# Patient Record
Sex: Female | Born: 1995 | Race: Black or African American | Hispanic: No | Marital: Single | Smoking: Never smoker
Health system: Southern US, Community
[De-identification: ages and names within clinical notes are randomized; demographics above are authoritative.]

## PROBLEM LIST (undated history)

## (undated) DIAGNOSIS — D649 Anemia, unspecified: Secondary | ICD-10-CM

## (undated) DIAGNOSIS — E559 Vitamin D deficiency, unspecified: Secondary | ICD-10-CM

## (undated) HISTORY — DX: Anemia, unspecified: D64.9

## (undated) HISTORY — DX: Vitamin D deficiency, unspecified: E55.9

---
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
## 2016-03-05 DIAGNOSIS — D500 Iron deficiency anemia secondary to blood loss (chronic): Secondary | ICD-10-CM | POA: Diagnosis not present

## 2016-03-05 DIAGNOSIS — R5383 Other fatigue: Secondary | ICD-10-CM | POA: Diagnosis not present

## 2016-03-05 DIAGNOSIS — J309 Allergic rhinitis, unspecified: Secondary | ICD-10-CM | POA: Diagnosis not present

## 2016-03-05 DIAGNOSIS — D508 Other iron deficiency anemias: Secondary | ICD-10-CM | POA: Diagnosis not present

## 2017-01-19 DIAGNOSIS — D509 Iron deficiency anemia, unspecified: Secondary | ICD-10-CM | POA: Diagnosis not present

## 2017-01-19 DIAGNOSIS — R5383 Other fatigue: Secondary | ICD-10-CM | POA: Diagnosis not present

## 2017-01-19 DIAGNOSIS — Z13220 Encounter for screening for lipoid disorders: Secondary | ICD-10-CM | POA: Diagnosis not present

## 2017-01-19 DIAGNOSIS — N921 Excessive and frequent menstruation with irregular cycle: Secondary | ICD-10-CM | POA: Diagnosis not present

## 2017-01-19 DIAGNOSIS — Z0000 Encounter for general adult medical examination without abnormal findings: Secondary | ICD-10-CM | POA: Diagnosis not present

## 2017-04-07 DIAGNOSIS — L509 Urticaria, unspecified: Secondary | ICD-10-CM | POA: Diagnosis not present

## 2017-04-07 DIAGNOSIS — L578 Other skin changes due to chronic exposure to nonionizing radiation: Secondary | ICD-10-CM | POA: Diagnosis not present

## 2017-05-03 DIAGNOSIS — D509 Iron deficiency anemia, unspecified: Secondary | ICD-10-CM | POA: Diagnosis not present

## 2017-05-03 DIAGNOSIS — E559 Vitamin D deficiency, unspecified: Secondary | ICD-10-CM | POA: Diagnosis not present

## 2017-05-03 DIAGNOSIS — R5383 Other fatigue: Secondary | ICD-10-CM | POA: Diagnosis not present

## 2017-05-03 DIAGNOSIS — R202 Paresthesia of skin: Secondary | ICD-10-CM | POA: Diagnosis not present

## 2017-05-03 DIAGNOSIS — N921 Excessive and frequent menstruation with irregular cycle: Secondary | ICD-10-CM | POA: Diagnosis not present

## 2018-08-18 DIAGNOSIS — J4520 Mild intermittent asthma, uncomplicated: Secondary | ICD-10-CM | POA: Diagnosis not present

## 2018-08-18 DIAGNOSIS — J029 Acute pharyngitis, unspecified: Secondary | ICD-10-CM | POA: Diagnosis not present

## 2018-08-18 DIAGNOSIS — J069 Acute upper respiratory infection, unspecified: Secondary | ICD-10-CM | POA: Diagnosis not present

## 2018-08-31 DIAGNOSIS — Z3041 Encounter for surveillance of contraceptive pills: Secondary | ICD-10-CM | POA: Diagnosis not present

## 2018-08-31 DIAGNOSIS — N946 Dysmenorrhea, unspecified: Secondary | ICD-10-CM | POA: Diagnosis not present

## 2018-10-11 DIAGNOSIS — G44209 Tension-type headache, unspecified, not intractable: Secondary | ICD-10-CM | POA: Diagnosis not present

## 2018-10-25 DIAGNOSIS — H52223 Regular astigmatism, bilateral: Secondary | ICD-10-CM | POA: Diagnosis not present

## 2018-12-20 DIAGNOSIS — R590 Localized enlarged lymph nodes: Secondary | ICD-10-CM | POA: Diagnosis not present

## 2019-09-11 DIAGNOSIS — Z30011 Encounter for initial prescription of contraceptive pills: Secondary | ICD-10-CM | POA: Diagnosis not present

## 2019-09-11 DIAGNOSIS — R591 Generalized enlarged lymph nodes: Secondary | ICD-10-CM | POA: Diagnosis not present

## 2020-04-13 DIAGNOSIS — Z20822 Contact with and (suspected) exposure to covid-19: Secondary | ICD-10-CM | POA: Diagnosis not present

## 2020-04-25 DIAGNOSIS — R21 Rash and other nonspecific skin eruption: Secondary | ICD-10-CM | POA: Diagnosis not present

## 2020-04-25 DIAGNOSIS — R202 Paresthesia of skin: Secondary | ICD-10-CM | POA: Diagnosis not present

## 2020-04-25 DIAGNOSIS — E559 Vitamin D deficiency, unspecified: Secondary | ICD-10-CM | POA: Diagnosis not present

## 2020-04-25 DIAGNOSIS — M79646 Pain in unspecified finger(s): Secondary | ICD-10-CM | POA: Diagnosis not present

## 2020-04-26 DIAGNOSIS — R202 Paresthesia of skin: Secondary | ICD-10-CM | POA: Diagnosis not present

## 2020-04-26 DIAGNOSIS — E559 Vitamin D deficiency, unspecified: Secondary | ICD-10-CM | POA: Diagnosis not present

## 2020-04-26 DIAGNOSIS — M79646 Pain in unspecified finger(s): Secondary | ICD-10-CM | POA: Diagnosis not present

## 2020-05-09 DIAGNOSIS — Z20828 Contact with and (suspected) exposure to other viral communicable diseases: Secondary | ICD-10-CM | POA: Diagnosis not present

## 2020-06-20 DIAGNOSIS — M2550 Pain in unspecified joint: Secondary | ICD-10-CM | POA: Diagnosis not present

## 2020-12-11 DIAGNOSIS — R202 Paresthesia of skin: Secondary | ICD-10-CM | POA: Diagnosis not present

## 2020-12-11 DIAGNOSIS — N921 Excessive and frequent menstruation with irregular cycle: Secondary | ICD-10-CM | POA: Diagnosis not present

## 2021-03-05 ENCOUNTER — Encounter: Payer: Self-pay | Admitting: *Deleted

## 2021-03-05 ENCOUNTER — Other Ambulatory Visit: Payer: Self-pay

## 2021-03-05 ENCOUNTER — Ambulatory Visit: Payer: BC Managed Care – PPO | Admitting: Neurology

## 2021-03-05 ENCOUNTER — Encounter: Payer: Self-pay | Admitting: Neurology

## 2021-03-05 VITALS — BP 121/80 | HR 58 | Ht 65.0 in | Wt 171.0 lb

## 2021-03-05 DIAGNOSIS — R200 Anesthesia of skin: Secondary | ICD-10-CM | POA: Insufficient documentation

## 2021-03-05 DIAGNOSIS — D649 Anemia, unspecified: Secondary | ICD-10-CM | POA: Insufficient documentation

## 2021-03-05 DIAGNOSIS — R202 Paresthesia of skin: Secondary | ICD-10-CM

## 2021-03-05 DIAGNOSIS — E538 Deficiency of other specified B group vitamins: Secondary | ICD-10-CM

## 2021-03-05 NOTE — Progress Notes (Signed)
[facility]    Provider:  Dr Lucia Gaskins Requesting Provider: Laurann Montana, MD Primary Care Provider:  Merri Brunette, MD  CC:  numbness and tingling in the hands  HPI:  Alexa Fowler is a 25 y.o. female here as requested by Laurann Montana, MD for bilateral intermittent hand numbness.  Past medical history eczema, allergic rhinitis, iron deficiency anemia, reactive airway disease, acute non-intractable tension type headache, paresthesias, GERD, mild intermittent asthma, vitamin D deficiency, dysmenorrhea.   Her hands get numb whenever she does anything strenuous, even washing hair, cleaning bathroom, the complete hand, nothing helps but time, she shakes them out but that doesn't help, she can get numb in the right arm when she is sleeping on it but otherwise no nocturnal or morning numbness. Lately with sleeping the arm gets numb too. Also tingly like falling asleep with the hands. Happens bilaterally, lasts a few minutes but progressively getting worse and lasting longer. Washing hair it lasts 10-15 minutes, harder activities lasted for an hour and nothing helps. Started 2-3 years, no inciting events, no neck pain or radicular symptoms, very uncomfortable when it occurs but not necessarily painful, she does feel her hands have been weak. Nothing in the feet, legs, torso or face. Occ she gets a shooting pain from her hip down to the leg. No other focal neurologic deficits, associated symptoms, inciting events or modifiable factors.    I reviewed Dr. Lucilla Lame notes, her exam is benign, symptoms are persisting, symptoms sound consistent with carpal tunnel but are not reproducible, referral to neurology.  Patient has seen rheumatology, appears she had joint pain, she did not have a rheumatologic diagnosis, ANA and RF were both positive, she continues to have numbness in her hands, otology recommended and neurologic referral, comes and goes may last 5 to 30 minutes maybe once a week not  associated with certain activities.  Her hands feel like they fell asleep with pins-and-needles, worse with doing activities such as washing hair lifting objects washing dishes, this resolves quickly after cessation of activity, has also noticed them in the toes.  Reviewed notes, labs and imaging from outside physicians, which showed:  Reviewed blood work including CBC, CMP, B12, ferritin collected April 26, 2020: CBC with differential normal, CMP essentially normal BUN 6 creatinine 0.68, vitamin D 49, vitamin B12 203, ANA IFA positive, rheumatoid factor positive.  Review of Systems: Patient complains of symptoms per HPI as well as the following symptoms numbness and tingling. Pertinent negatives and positives per HPI. All others negative.   Social History   Socioeconomic History   Marital status: Single    Spouse name: Not on file   Number of children: Not on file   Years of education: Not on file   Highest education level: Not on file  Occupational History   Not on file  Tobacco Use   Smoking status: Never   Smokeless tobacco: Never  Vaping Use   Vaping Use: Never used  Substance and Sexual Activity   Alcohol use: Yes    Comment: "not often"   Drug use: Never   Sexual activity: Not on file  Other Topics Concern   Not on file  Social History Narrative   Lives with family    Right handed   Caffeine: 1 cup/day   Social Determinants of Health   Financial Resource Strain: Not on file  Food Insecurity: Not on file  Transportation Needs: Not on file  Physical Activity: Not on file  Stress: Not on  file  Social Connections: Not on file  Intimate Partner Violence: Not on file    Family History  Problem Relation Age of Onset   Rheum arthritis Mother    Sleep apnea Mother    Heart Problems Father    Diabetes Father     Past Medical History:  Diagnosis Date   Anemia    Vitamin D deficiency     Patient Active Problem List   Diagnosis Date Noted   Anemia  03/05/2021   Numbness and tingling of hand 03/05/2021    History reviewed. No pertinent surgical history.  Current Outpatient Medications  Medication Sig Dispense Refill   ALBUTEROL IN Inhale into the lungs as needed.     Ascorbic Acid (VITAMIN C PO) Take by mouth.     Cetirizine HCl (ZYRTEC PO) Take by mouth daily.     Ferrous Sulfate (IRON PO) Take by mouth.     JUNEL FE 24 1-20 MG-MCG(24) tablet Take 1 tablet by mouth daily.     VITAMIN D PO Take by mouth.     No current facility-administered medications for this visit.    Allergies as of 03/05/2021   (Not on File)    Vitals: BP 121/80 (BP Location: Right Arm, Patient Position: Sitting)   Pulse (!) 58   Ht 5\' 5"  (1.651 m)   Wt 171 lb (77.6 kg)   BMI 28.46 kg/m  Last Weight:  Wt Readings from Last 1 Encounters:  03/05/21 171 lb (77.6 kg)   Last Height:   Ht Readings from Last 1 Encounters:  03/05/21 5\' 5"  (1.651 m)     Physical exam: Exam: Gen: NAD, conversant, well nourised, well groomed                     CV: RRR, no MRG. No Carotid Bruits. No peripheral edema, warm, nontender Eyes: Conjunctivae clear without exudates or hemorrhage  Neuro: Detailed Neurologic Exam  Speech:    Speech is normal; fluent and spontaneous with normal comprehension.  Cognition:    The patient is oriented to person, place, and time;     recent and remote memory intact;     language fluent;     normal attention, concentration,     fund of knowledge Cranial Nerves:    The pupils are equal, round, and reactive to light. The fundi are flat Visual fields are full to finger confrontation. Extraocular movements are intact. Trigeminal sensation is intact and the muscles of mastication are normal. The face is symmetric. The palate elevates in the midline. Hearing intact. Voice is normal. Shoulder shrug is normal. The tongue has normal motion without fasciculations.   Coordination:    Normal   Gait:    normal.   Motor  Observation:    No asymmetry, no atrophy, and no involuntary movements noted. Tone:    Normal muscle tone.    Posture:    Posture is normal. normal erect    Strength:    Strength is V/V in the upper and lower limbs.      Sensation: intact to LT     Reflex Exam:  DTR's:    Deep tendon reflexes in the upper and lower extremities are normal bilaterally.   Toes:    The toes are downgoing bilaterally.   Clonus:    Clonus is absent.    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 Advised conservative measures such as bracing. Will order EMG/NCS but patient can cancel if she feels much  better with bracing.   B12 202 which is very low so there is a possibility of B12 deficiency so will recheck and also add an MMA  Orders Placed This Encounter  Procedures   B12 and Folate Panel   Methylmalonic acid, serum   NCV with EMG(electromyography)   No orders of the defined types were placed in this encounter.   Cc: Laurann Montana, MD,  Merri Brunette, MD  Naomie Dean, MD  [facility] [address] [place], [place] [address]  Phone [phone] Fax [phone]

## 2021-03-05 NOTE — Patient Instructions (Signed)
  Carpal Tunnel Syndrome  Carpal tunnel syndrome is a condition that causes pain, numbness, and weakness in your hand and fingers. The carpal tunnel is a narrow area located on the palm side of your wrist. Repeated wrist motion or certain diseases may cause swelling within the tunnel. This swelling pinches the main nerve in the wrist.The main nerve in the wrist is called the median nerve. What are the causes? This condition may be caused by: Repeated and forceful wrist and hand motions. Wrist injuries. Arthritis. A cyst or tumor in the carpal tunnel. Fluid buildup during pregnancy. Use of tools that vibrate. Sometimes the cause of this condition is not known. What increases the risk? The following factors may make you more likely to develop this condition: Having a job that requires you to repeatedly or forcefully move your wrist or hand or requires you to use tools that vibrate. This may include jobs that involve using computers, working on an assembly line, or working with power tools such as drills or sanders. Being a woman. Having certain conditions, such as: Diabetes. Obesity. An underactive thyroid (hypothyroidism). Kidney failure. Rheumatoid arthritis. What are the signs or symptoms? Symptoms of this condition include: A tingling feeling in your fingers, especially in your thumb, index, and middle fingers. Tingling or numbness in your hand. An aching feeling in your entire arm, especially when your wrist and elbow are bent for a long time. Wrist pain that goes up your arm to your shoulder. Pain that goes down into your palm or fingers. A weak feeling in your hands. You may have trouble grabbing and holding items. Your symptoms may feel worse during the night. How is this diagnosed? This condition is diagnosed with a medical history and physical exam. You may also have tests, including: Electromyogram (EMG). This test measures electrical signals sent by your nerves into the  muscles. Nerve conduction study. This test measures how well electrical signals pass through your nerves. Imaging tests, such as X-rays, ultrasound, and MRI. These tests check for possible causes of your condition. How is this treated? This condition may be treated with: Lifestyle changes. It is important to stop or change the activity that caused your condition. Doing exercise and activities to strengthen and stretch your muscles and tendons (physical therapy). Making lifestyle changes to help with your condition and learning how to do your daily activities safely (occupational therapy). Medicines for pain and inflammation. This may include medicine that is injected into your wrist. A wrist splint or brace. Surgery. Follow these instructions at home: If you have a splint or brace: Wear the splint or brace as told by your health care provider. Remove it only as told by your health care provider. Loosen the splint or brace if your fingers tingle, become numb, or turn cold and blue. Keep the splint or brace clean. If the splint or brace is not waterproof: Do not let it get wet. Cover it with a watertight covering when you take a bath or shower. Managing pain, stiffness, and swelling If directed, put ice on the painful area. To do this: If you have a removeable splint or brace, remove it as told by your health care provider. Put ice in a plastic bag. Place a towel between your skin and the bag or between the splint or brace and the bag. Leave the ice on for 20 minutes, 2-3 times a day. Do not fall asleep with the cold pack on your skin. Remove the ice if your skin   turns bright red. This is very important. If you cannot feel pain, heat, or cold, you have a greater risk of damage to the area. Move your fingers often to reduce stiffness and swelling. General instructions Take over-the-counter and prescription medicines only as told by your health care provider. Rest your wrist and hand from  any activity that may be causing your pain. If your condition is work related, talk with your employer about changes that can be made, such as getting a wrist pad to use while typing. Do any exercises as told by your health care provider, physical therapist, or occupational therapist. Keep all follow-up visits. This is important. Contact a health care provider if: You have new symptoms. Your pain is not controlled with medicines. Your symptoms get worse. Get help right away if: You have severe numbness or tingling in your wrist or hand. Summary Carpal tunnel syndrome is a condition that causes pain, numbness, and weakness in your hand and fingers. It is usually caused by repeated wrist motions. Lifestyle changes and medicines are used to treat carpal tunnel syndrome. Surgery may be recommended. Follow your health care provider's instructions about wearing a splint, resting from activity, keeping follow-up visits, and calling for help. This information is not intended to replace advice given to you by your health care provider. Make sure you discuss any questions you have with your healthcare provider. Document Revised: 12/21/2019 Document Reviewed: 12/21/2019 Elsevier Patient Education  2022 [employer]  Electromyoneurogram Electromyoneurogram is a test to check how well your muscles and nerves are working. This procedure includes the combined use of electromyogram (EMG) and nerve conduction study (NCS). EMG is used to look for muscular disorders. NCS, which is also called electroneurogram, measures how well your nerves arecontrolling your muscles. The procedures are usually done together to check if your muscles and nerves are healthy. If the results of the tests are abnormal, this may indicatedisease or injury, such as a neuromuscular disease or peripheral nerve damage. Tell a health care provider about: Any allergies you have. All medicines you are taking, including vitamins, herbs, eye  drops, creams, and over-the-counter medicines. Any problems you or family members have had with anesthetic medicines. Any blood disorders you have. Any surgeries you have had. Any medical conditions you have. If you have a pacemaker. Whether you are pregnant or may be pregnant. What are the risks? Generally, this is a safe procedure. However, problems may occur, including: Infection where the electrodes were inserted. Bleeding. What happens before the procedure? Medicines Ask your health care provider about: Changing or stopping your regular medicines. This is especially important if you are taking diabetes medicines or blood thinners. Taking medicines such as aspirin and ibuprofen. These medicines can thin your blood. Do not take these medicines unless your health care provider tells you to take them. Taking over-the-counter medicines, vitamins, herbs, and supplements. General instructions Your health care provider may ask you to avoid: Beverages that have caffeine, such as coffee and tea. Any products that contain nicotine or tobacco. These products include cigarettes, e-cigarettes, and chewing tobacco. If you need help quitting, ask your health care provider. Do not use lotions or creams on the same day that you will be having the procedure. What happens during the procedure? For EMG  Your health care provider will ask you to stay in a position so that he or she can access the muscle that will be studied. You may be standing, sitting, or lying down. You may be given a   medicine that numbs the area (local anesthetic). A very thin needle that has an electrode will be inserted into your muscle. Another small electrode will be placed on your skin near the muscle. Your health care provider will ask you to continue to remain still. The electrodes will send a signal that tells about the electrical activity of your muscles. You may see this on a monitor or hear it in the room. After your  muscles have been studied at rest, your health care provider will ask you to contract or flex your muscles. The electrodes will send a signal that tells about the electrical activity of your muscles. Your health care provider will remove the electrodes and the electrode needles when the procedure is finished. The procedure may vary among health care providers and hospitals. For NCS  An electrode that records your nerve activity (recording electrode) will be placed on your skin by the muscle that is being studied. An electrode that is used as a reference (reference electrode) will be placed near the recording electrode. A paste or gel will be applied to your skin between the recording electrode and the reference electrode. Your nerve will be stimulated with a mild shock. Your health care provider will measure how much time it takes for your muscle to react. Your health care provider will remove the electrodes and the gel when the procedure is finished. The procedure may vary among health care providers and hospitals. What happens after the procedure? It is up to you to get the results of your procedure. Ask your health care provider, or the department that is doing the procedure, when your results will be ready. Your health care provider may: Give you medicines for any pain. Monitor the insertion sites to make sure that bleeding stops. Summary Electromyoneurogram is a test to check how well your muscles and nerves are working. If the results of the tests are abnormal, this may indicate disease or injury. This is a safe procedure. However, problems may occur, such as bleeding and infection. Your health care provider will do two tests to complete this procedure. One checks your muscles (EMG) and another checks your nerves (NCS). It is up to you to get the results of your procedure. Ask your health care provider, or the department that is doing the procedure, when your results will be ready. This  information is not intended to replace advice given to you by your health care provider. Make sure you discuss any questions you have with your healthcare provider. Document Revised: 04/26/2018 Document Reviewed: 04/08/2018 Elsevier Patient Education  2022 [employer]  

## 2021-03-08 LAB — B12 AND FOLATE PANEL
Folate: >20 ng/mL (ref >3.0)
Vitamin B-12: 321 pg/mL (ref 232–1245)

## 2021-03-08 LAB — METHYLMALONIC ACID, SERUM: Methylmalonic Acid: 87 nmol/L (ref 0–378)

## 2021-03-10 ENCOUNTER — Telehealth: Payer: Self-pay

## 2021-03-10 DIAGNOSIS — Z124 Encounter for screening for malignant neoplasm of cervix: Secondary | ICD-10-CM | POA: Diagnosis not present

## 2021-03-10 DIAGNOSIS — Z01419 Encounter for gynecological examination (general) (routine) without abnormal findings: Secondary | ICD-10-CM | POA: Diagnosis not present

## 2021-03-10 NOTE — Telephone Encounter (Signed)
Pt verified by name and DOB,  normal results given per provider, pt voiced understanding all question answered. °

## 2021-03-10 NOTE — Telephone Encounter (Signed)
-----   Message from Anson Fret, MD sent at 03/09/2021  9:52 AM EDT ----- B12 is currently normal. I would however make sure to include foods in your diet with B12 or take a daily multivitamin thanks

## 2021-04-10 ENCOUNTER — Encounter: Payer: BC Managed Care – PPO | Admitting: Neurology

## 2021-05-15 ENCOUNTER — Encounter: Payer: BC Managed Care – PPO | Admitting: Neurology

## 2021-06-05 ENCOUNTER — Encounter: Payer: BC Managed Care – PPO | Admitting: Neurology

## 2021-09-30 DIAGNOSIS — R051 Acute cough: Secondary | ICD-10-CM | POA: Diagnosis not present

## 2021-09-30 DIAGNOSIS — J069 Acute upper respiratory infection, unspecified: Secondary | ICD-10-CM | POA: Diagnosis not present

## 2021-10-02 DIAGNOSIS — J208 Acute bronchitis due to other specified organisms: Secondary | ICD-10-CM | POA: Diagnosis not present

## 2022-05-22 DIAGNOSIS — Z8709 Personal history of other diseases of the respiratory system: Secondary | ICD-10-CM | POA: Diagnosis not present

## 2022-05-22 DIAGNOSIS — R059 Cough, unspecified: Secondary | ICD-10-CM | POA: Diagnosis not present

## 2022-05-22 DIAGNOSIS — J40 Bronchitis, not specified as acute or chronic: Secondary | ICD-10-CM | POA: Diagnosis not present

## 2022-05-22 DIAGNOSIS — J019 Acute sinusitis, unspecified: Secondary | ICD-10-CM | POA: Diagnosis not present
